# Patient Record
Sex: Female | Born: 1995 | Race: Black or African American | Hispanic: No | Marital: Single | Smoking: Never smoker
Health system: Southern US, Community
[De-identification: ages and names within clinical notes are randomized; demographics above are authoritative.]

---

## 2019-01-14 ENCOUNTER — Other Ambulatory Visit: Payer: Self-pay | Admitting: Nurse Practitioner

## 2019-01-14 ENCOUNTER — Ambulatory Visit
Admission: RE | Admit: 2019-01-14 | Discharge: 2019-01-14 | Disposition: A | Discharge status: Home / Self Care | Payer: Commercial Managed Care - PPO | Source: Ambulatory Visit | Attending: Nurse Practitioner | Admitting: Nurse Practitioner

## 2019-01-14 DIAGNOSIS — M25531 Pain in right wrist: Secondary | ICD-10-CM

## 2019-02-12 ENCOUNTER — Other Ambulatory Visit: Payer: Self-pay | Admitting: Family Medicine

## 2019-02-12 DIAGNOSIS — E041 Nontoxic single thyroid nodule: Secondary | ICD-10-CM

## 2019-02-19 ENCOUNTER — Other Ambulatory Visit: Payer: Commercial Managed Care - PPO

## 2019-03-02 ENCOUNTER — Ambulatory Visit
Admission: RE | Admit: 2019-03-02 | Discharge: 2019-03-02 | Disposition: A | Discharge status: Home / Self Care | Payer: Commercial Managed Care - PPO | Source: Ambulatory Visit | Attending: Family Medicine | Admitting: Family Medicine

## 2019-03-02 ENCOUNTER — Other Ambulatory Visit: Payer: Self-pay

## 2019-03-02 DIAGNOSIS — E041 Nontoxic single thyroid nodule: Secondary | ICD-10-CM

## 2019-03-02 MED ORDER — IOPAMIDOL (ISOVUE-300) INJECTION 61%
75.0000 mL | Freq: Once | INTRAVENOUS | Status: AC | PRN
  Administered 2019-03-02: 75 mL via INTRAVENOUS

## 2019-05-12 ENCOUNTER — Other Ambulatory Visit: Payer: Self-pay

## 2019-05-12 ENCOUNTER — Ambulatory Visit
Admission: RE | Admit: 2019-05-12 | Discharge: 2019-05-12 | Disposition: A | Discharge status: Home / Self Care | Payer: Commercial Managed Care - PPO | Source: Ambulatory Visit | Attending: Nurse Practitioner | Admitting: Nurse Practitioner

## 2019-05-12 ENCOUNTER — Other Ambulatory Visit: Payer: Self-pay | Admitting: Nurse Practitioner

## 2019-05-12 DIAGNOSIS — M25532 Pain in left wrist: Secondary | ICD-10-CM

## 2020-01-21 ENCOUNTER — Other Ambulatory Visit (HOSPITAL_COMMUNITY)
Admission: RE | Admit: 2020-01-21 | Discharge: 2020-01-21 | Disposition: A | Discharge status: Home / Self Care | Payer: Commercial Managed Care - PPO | Source: Ambulatory Visit | Attending: Nurse Practitioner | Admitting: Nurse Practitioner

## 2020-01-21 ENCOUNTER — Other Ambulatory Visit: Payer: Self-pay | Admitting: Nurse Practitioner

## 2020-01-21 DIAGNOSIS — Z124 Encounter for screening for malignant neoplasm of cervix: Secondary | ICD-10-CM | POA: Insufficient documentation

## 2020-01-26 LAB — CYTOLOGY - PAP
Adequacy: ABSENT
Chlamydia: NEGATIVE
Comment: NEGATIVE
Comment: NEGATIVE
Comment: NEGATIVE
Comment: NEGATIVE
Comment: NORMAL
Diagnosis: NEGATIVE
HSV1: NEGATIVE
HSV2: NEGATIVE
High risk HPV: NEGATIVE
Neisseria Gonorrhea: NEGATIVE
Trichomonas: NEGATIVE

## 2020-09-02 IMAGING — CT CT NECK WITH CONTRAST
2 of 4 series · 5 of 14 positions shown, 6 images · IV contrast (iopamidol)
Comparison: None.

CLINICAL DATA: Enlarged thyroid physical exam.

EXAM:
CT NECK WITH CONTRAST
TECHNIQUE: Multidetector CT imaging of the neck was performed using the
standard protocol following the bolus administration of intravenous
contrast.
CONTRAST:  75mL [id number] IOPAMIDOL ([id number]) INJECTION 61%

[Series 3: neck · axial · 0.53mm/px · z∈[-240,-166]mm · 2 of 113 slices shown]
[im 38/113  bone]
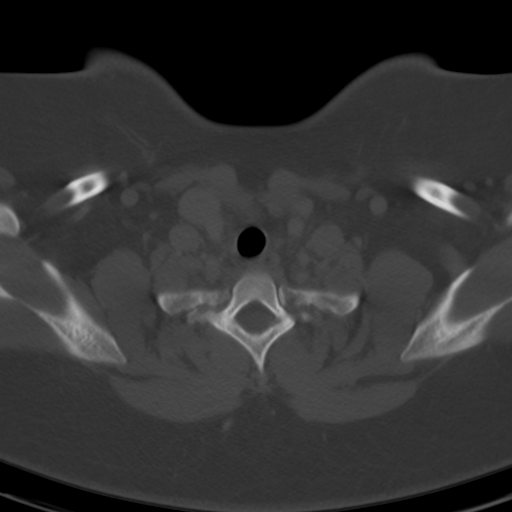
[im 75/113  bone]
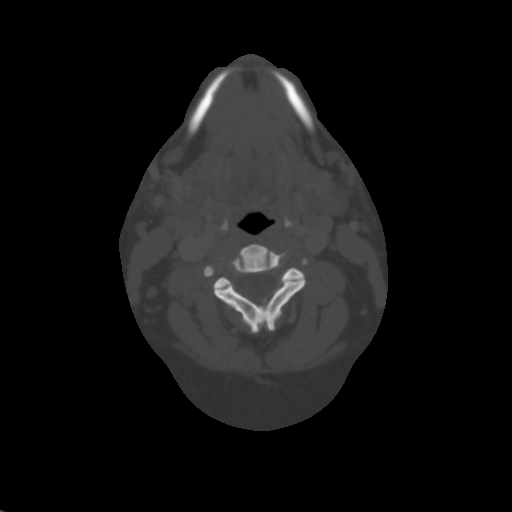

[Series 8: angled axial-oropharynx · axial · 0.52mm/px · z∈[-275,-164]mm · 3 of 113 slices shown, 4 images]
[im 29/113  soft-tissue]
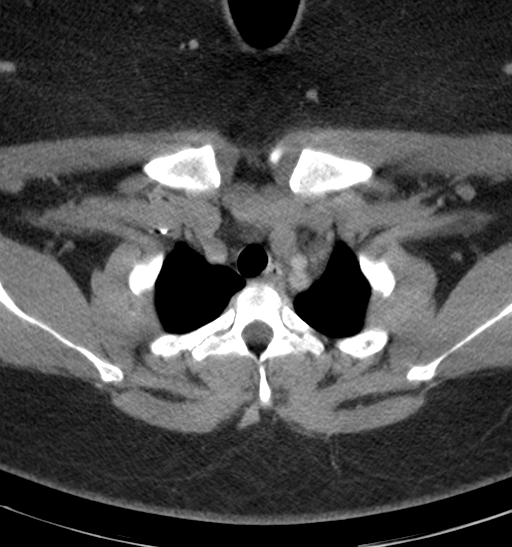
[im 29/113  bone]
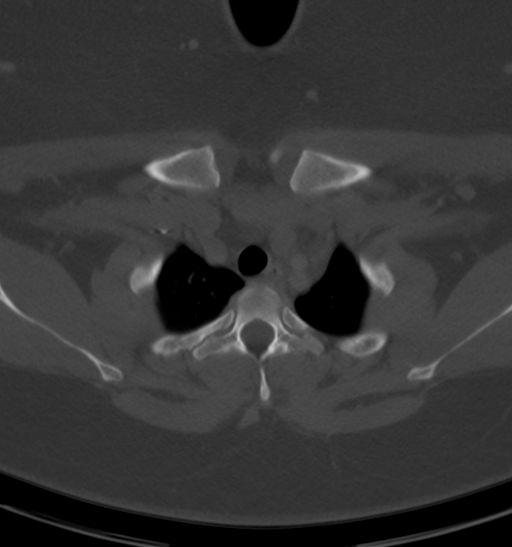
[im 57/113  bone]
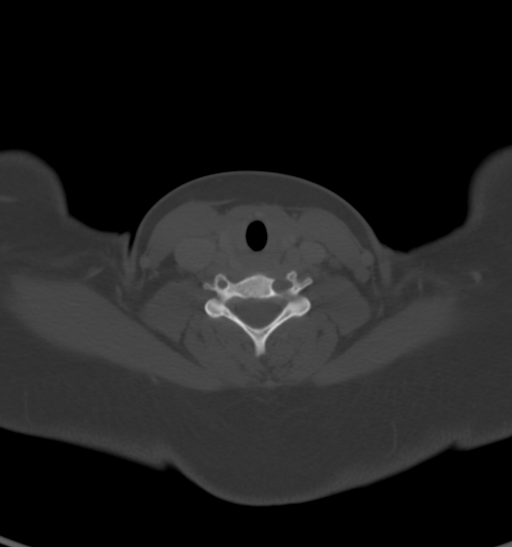
[im 85/113  bone]
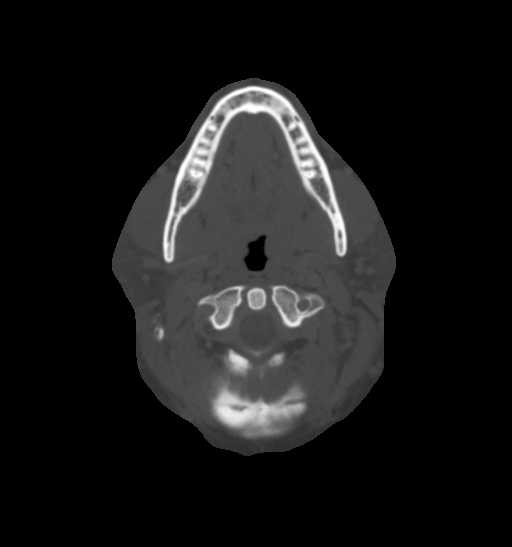

[5 of 14 positions shown; findings below may reference images not displayed]

FINDINGS: Pharynx and larynx: No mucosal or submucosal lesion.

Salivary glands: Parotid and submandibular glands are normal.

Thyroid: Thyroid gland appears normal by CT. Right and left lobes
are symmetric and do not show any focal lesion. Size appears within
normal limits.

Lymph nodes: No enlarged or low-density nodes on either side of the
neck. Normal bilateral cervical nodes.

Vascular: Normal

Limited intracranial: Normal

Visualized orbits: Not included.

Mastoids and visualized paranasal sinuses: Clear

Skeleton: Normal

Upper chest: Normal

Other: None
IMPRESSION: Normal CT scan of the neck. The thyroid gland appears to be normal
in size without evidence of focal lesion. No other regional mass
identified. No adenopathy.

## 2020-11-12 IMAGING — CR DG WRIST COMPLETE 3+V*L*
4 series · 4 of 4 positions shown · non-contrast
Comparison: None.

CLINICAL DATA: Pain with recent fall

EXAM:
LEFT WRIST - COMPLETE 3+ VIEW

[x wrist pa left]
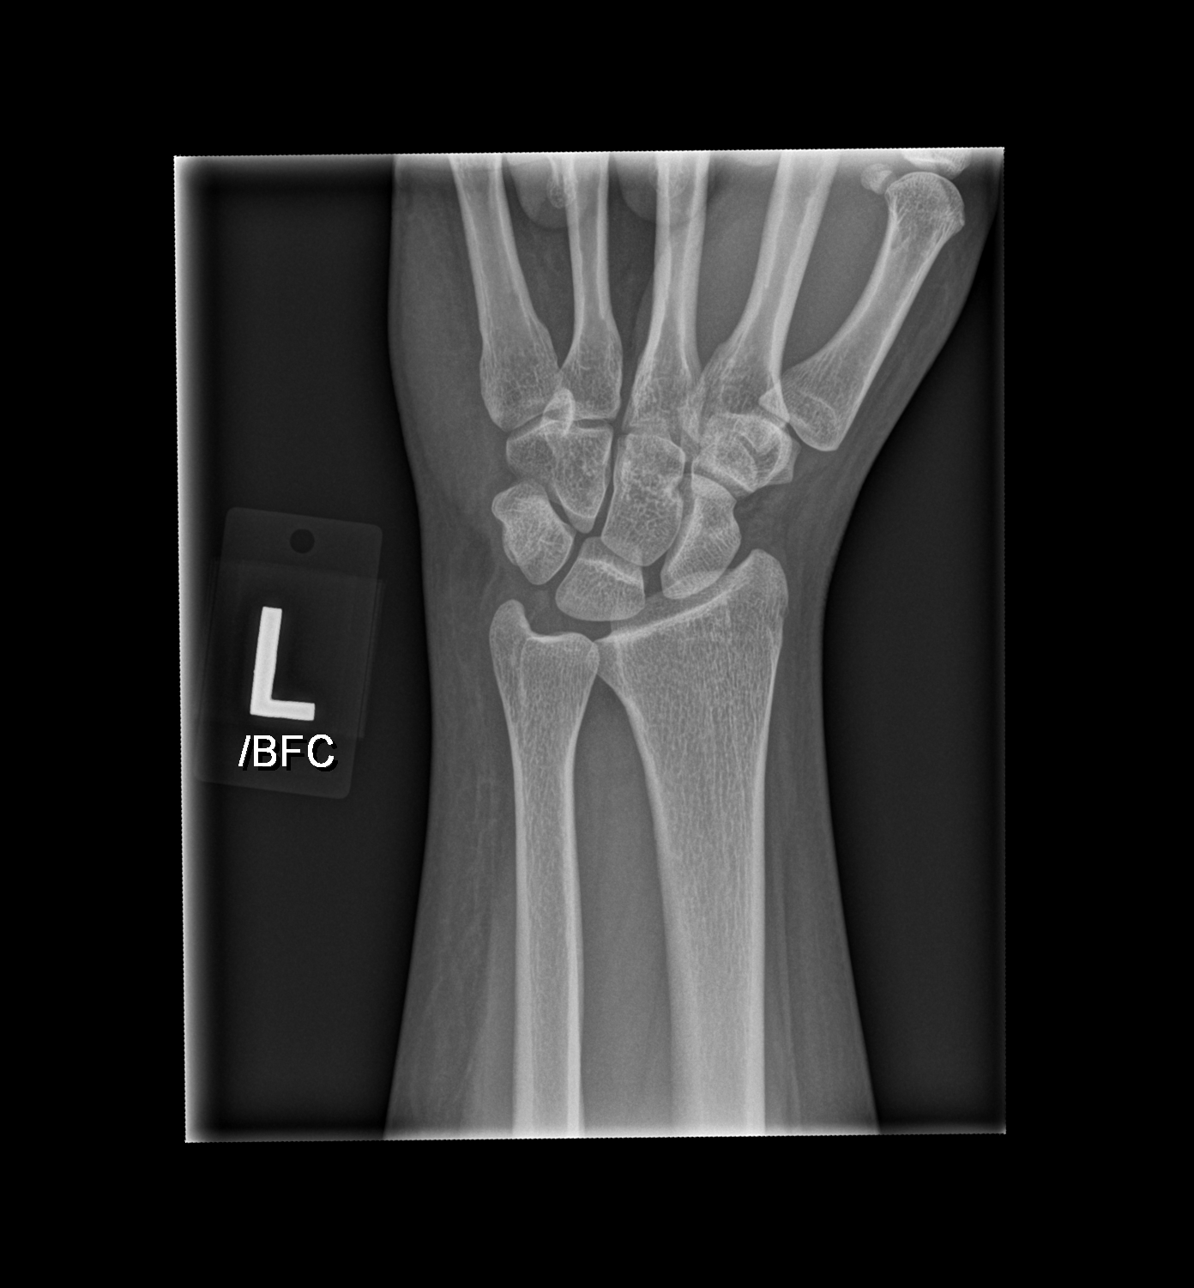

[x wrist obl left]
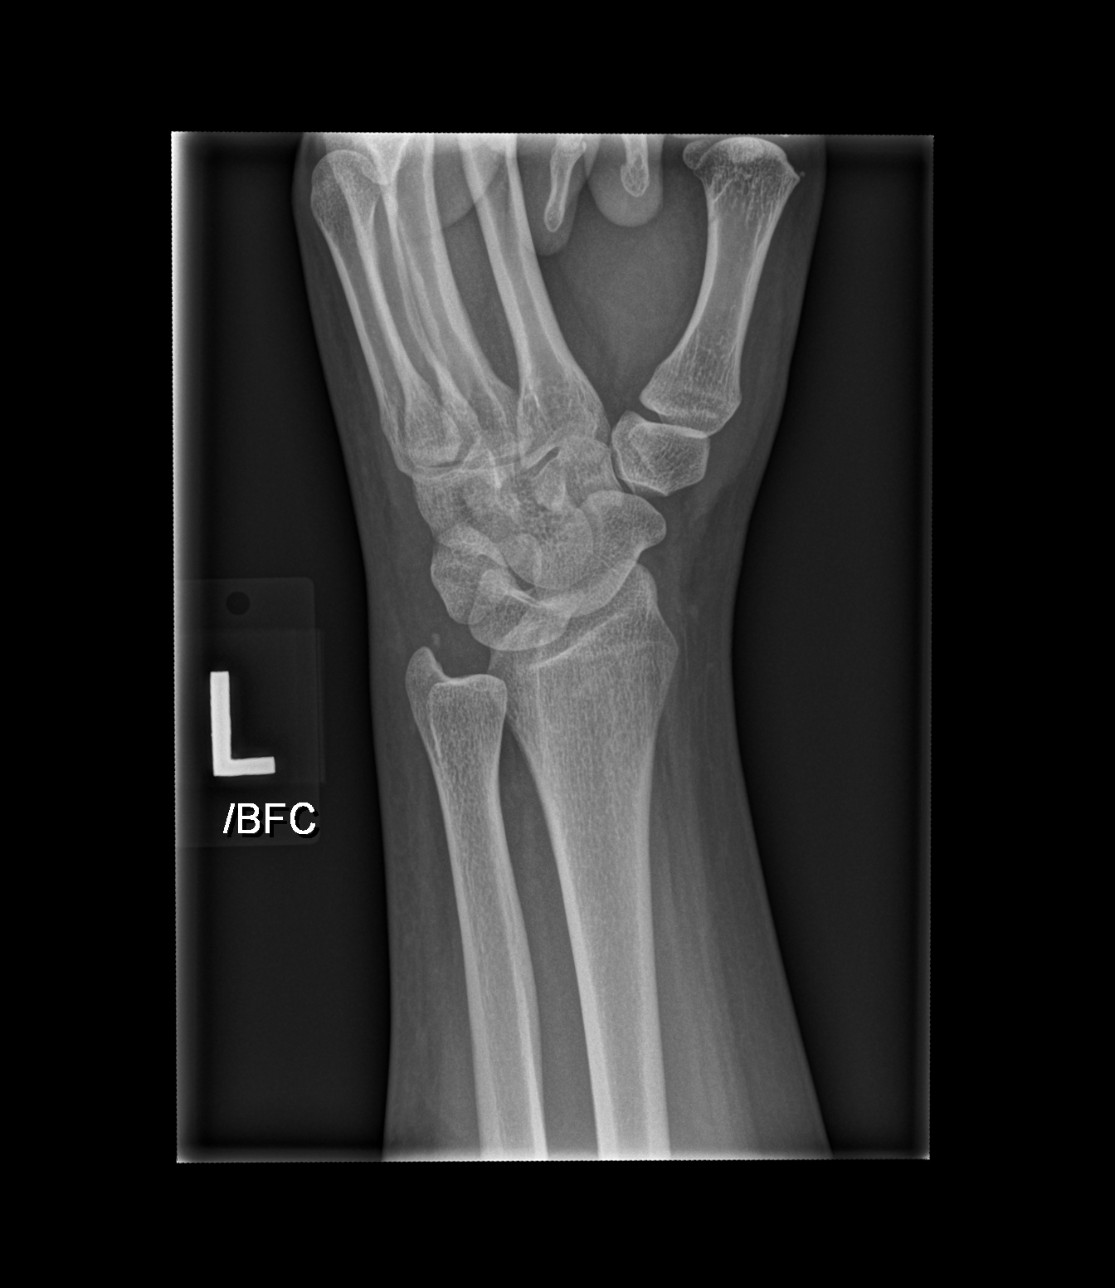

[x wrist lat left]
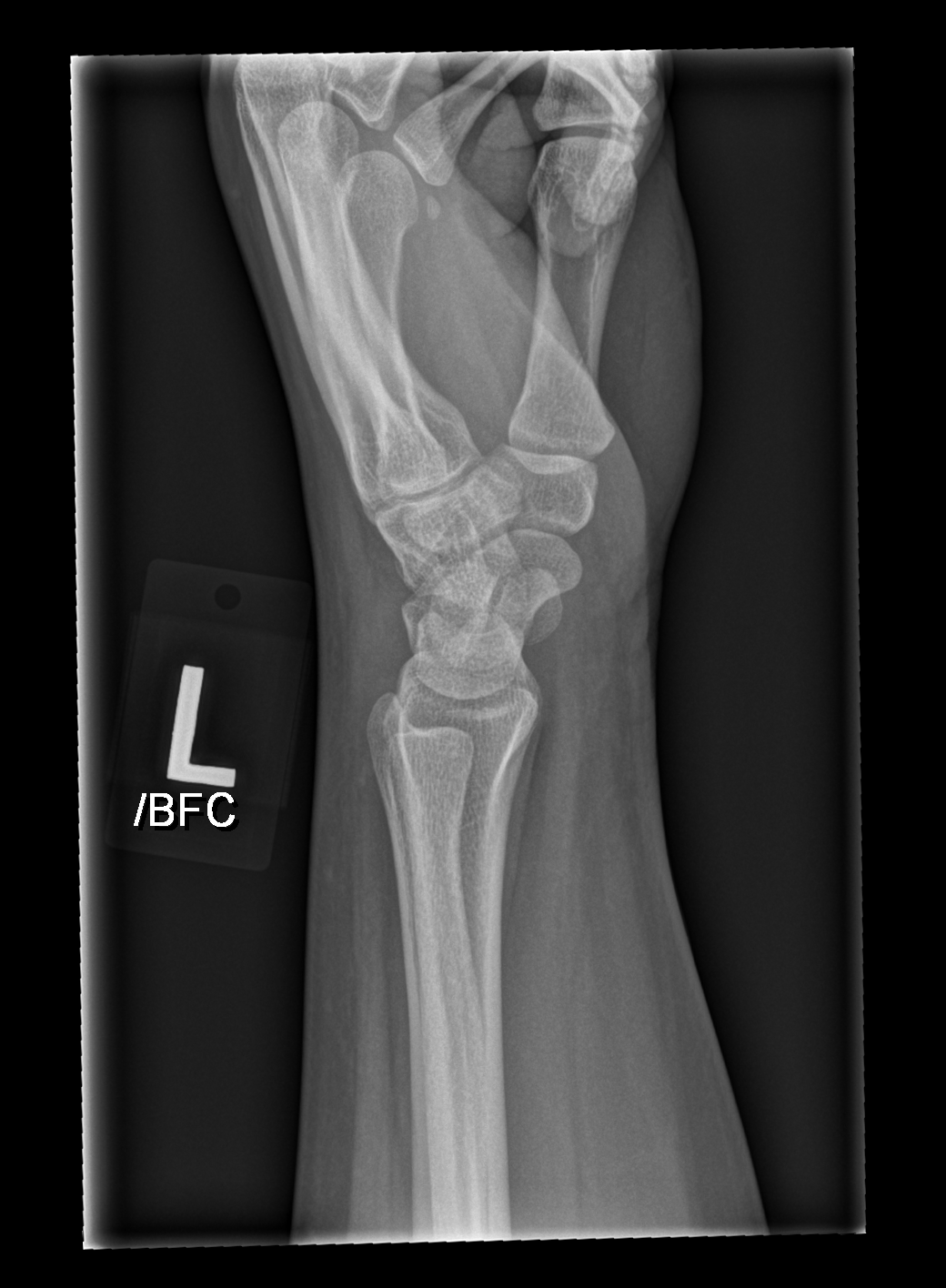

[x wrist navicular view left]
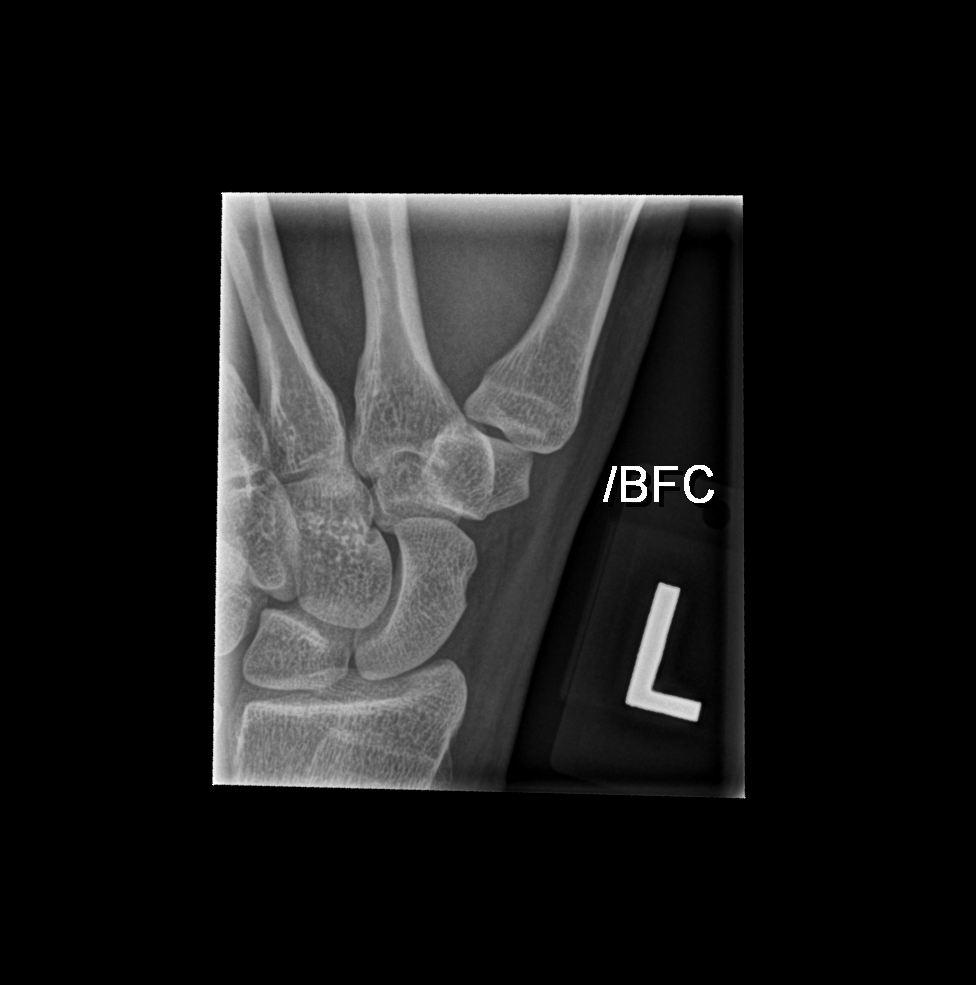

[4 of 4 positions shown; findings below may reference images not displayed]

FINDINGS: Frontal, oblique, lateral, and ulnar deviation scaphoid images were
obtained. There is a small age uncertain avulsion arising from the
ulnar styloid. No other evident fracture. No dislocation.

The space between the lunate and scaphoid on the frontal view is at
the upper limits of normal. Joint spaces appear normal. No erosive
change.
IMPRESSION: Age uncertain small avulsion arising from the ulnar styloid. Recent
injury in this area cannot be excluded by radiography. No other
evident fracture. No dislocation.

The space between the scaphoid and lunate on the frontal view is
toward the upper limits of normal. Advise clinical assessment of
this area to assess for possible ligamentous injury in this region.

No appreciable joint space narrowing or erosion.

These results will be called to the ordering clinician or
representative by the Radiologist Assistant, and communication
documented in the PACS or zVision Dashboard.

## 2021-10-13 ENCOUNTER — Encounter (HOSPITAL_BASED_OUTPATIENT_CLINIC_OR_DEPARTMENT_OTHER): Payer: Self-pay | Admitting: *Deleted

## 2021-10-13 ENCOUNTER — Emergency Department (HOSPITAL_BASED_OUTPATIENT_CLINIC_OR_DEPARTMENT_OTHER)
Admission: EM | Admit: 2021-10-13 | Discharge: 2021-10-13 | Disposition: A | Discharge status: Home / Self Care | Payer: BC Managed Care – PPO | Attending: Emergency Medicine | Admitting: Emergency Medicine

## 2021-10-13 ENCOUNTER — Emergency Department (HOSPITAL_BASED_OUTPATIENT_CLINIC_OR_DEPARTMENT_OTHER): Payer: BC Managed Care – PPO

## 2021-10-13 ENCOUNTER — Other Ambulatory Visit: Payer: Self-pay

## 2021-10-13 DIAGNOSIS — K8020 Calculus of gallbladder without cholecystitis without obstruction: Secondary | ICD-10-CM | POA: Diagnosis not present

## 2021-10-13 DIAGNOSIS — R1010 Upper abdominal pain, unspecified: Secondary | ICD-10-CM | POA: Diagnosis present

## 2021-10-13 LAB — COMPREHENSIVE METABOLIC PANEL
ALT: 13 U/L (ref 0–44)
AST: 31 U/L (ref 15–41)
Albumin: 3.9 g/dL (ref 3.5–5.0)
Alkaline Phosphatase: 46 U/L (ref 38–126)
Anion gap: 9 (ref 5–15)
BUN: <5 mg/dL — ABNORMAL LOW (ref 6–20)
CO2: 26 mmol/L (ref 22–32)
Calcium: 9.1 mg/dL (ref 8.9–10.3)
Chloride: 101 mmol/L (ref 98–111)
Creatinine, Ser: 0.68 mg/dL (ref 0.44–1.00)
GFR, Estimated: >60 mL/min (ref >60)
Glucose, Bld: 110 mg/dL — ABNORMAL HIGH (ref 70–99)
Potassium: 3.6 mmol/L (ref 3.5–5.1)
Sodium: 136 mmol/L (ref 135–145)
Total Bilirubin: 0.6 mg/dL (ref 0.3–1.2)
Total Protein: 7.8 g/dL (ref 6.5–8.1)

## 2021-10-13 LAB — CBC
HCT: 38.1 % (ref 36.0–46.0)
Hemoglobin: 12.3 g/dL (ref 12.0–15.0)
MCH: 26.3 pg (ref 26.0–34.0)
MCHC: 32.3 g/dL (ref 30.0–36.0)
MCV: 81.4 fL (ref 80.0–100.0)
Platelets: 302 10*3/uL (ref 150–400)
RBC: 4.68 MIL/uL (ref 3.87–5.11)
RDW: 14.7 % (ref 11.5–15.5)
WBC: 11.7 10*3/uL — ABNORMAL HIGH (ref 4.0–10.5)
nRBC: 0 % (ref 0.0–0.2)

## 2021-10-13 LAB — HCG, SERUM, QUALITATIVE: Preg, Serum: NEGATIVE

## 2021-10-13 LAB — LIPASE, BLOOD: Lipase: 35 U/L (ref 11–51)

## 2021-10-13 MED ORDER — OXYCODONE-ACETAMINOPHEN 5-325 MG PO TABS
1.0000 | ORAL_TABLET | Freq: Once | ORAL | Status: AC
  Administered 2021-10-13: 1 via ORAL
  Filled 2021-10-13: qty 1

## 2021-10-13 MED ORDER — IBUPROFEN 800 MG PO TABS: 800.0000 mg | ORAL_TABLET | Freq: Three times a day (TID) | ORAL | 0 refills | Status: AC | PRN

## 2021-10-13 MED ORDER — SODIUM CHLORIDE 0.9 % IV BOLUS
1000.0000 mL | Freq: Once | INTRAVENOUS | Status: AC
  Administered 2021-10-13: 1000 mL via INTRAVENOUS

## 2021-10-13 MED ORDER — ONDANSETRON HCL 4 MG/2ML IJ SOLN
4.0000 mg | Freq: Once | INTRAMUSCULAR | Status: AC
  Administered 2021-10-13: 4 mg via INTRAVENOUS
  Filled 2021-10-13: qty 2

## 2021-10-13 MED ORDER — SODIUM CHLORIDE 0.9 % IV SOLN
2.0000 g | Freq: Once | INTRAVENOUS | Status: AC
  Administered 2021-10-13: 2 g via INTRAVENOUS
  Filled 2021-10-13: qty 20

## 2021-10-13 MED ORDER — MORPHINE SULFATE (PF) 4 MG/ML IV SOLN
4.0000 mg | Freq: Once | INTRAVENOUS | Status: AC
  Administered 2021-10-13: 4 mg via INTRAVENOUS
  Filled 2021-10-13: qty 1

## 2021-10-13 MED ORDER — OXYCODONE-ACETAMINOPHEN 5-325 MG PO TABS: 1.0000 | ORAL_TABLET | Freq: Four times a day (QID) | ORAL | 0 refills | Status: AC | PRN

## 2021-10-13 MED ORDER — ONDANSETRON 4 MG PO TBDP: 4.0000 mg | ORAL_TABLET | Freq: Three times a day (TID) | ORAL | 0 refills | Status: AC | PRN

## 2021-10-13 NOTE — ED Provider Notes (Signed)
?[facility] EMERGENCY DEPARTMENT ?Provider Note ? ? ?CSN: [id number] ?Arrival date & time: 10/13/21  1433 ? ?  ? ?History ? ?Chief Complaint  ?Patient presents with  ? Abdominal Pain  ? ? ?Erika FurlongShalia Lemieux is a 26 y.o. female who presents to the ED today with complaint of gradual onset, constant, worsening, sharp, upper abdominal pain that began last night.  Patient reports issues with digestion over the past year.  She reports that she tried cutting out gluten in her diet which seem to help.  She does admit that yesterday she cooked shrimp and Crisco and gluten-free Panko crumbs as well as other vegetables.  She states that shortly afterwards she began noticing bloating in her abdomen and thought that she might be constipated.  She used a laxative and states that she has had her stool since that time.  She reports that shortly afterwards she began having severe upper abdominal pain.  She also complains of nausea and nonbloody nonbilious emesis.  She assumed that she might have food poisoning due to excessive vomiting.  [facility] today who sent her here for further evaluation due to the amount of discomfort she has been.  She has tried Tylenol for pain without relief.  Normal menstrual cycle 2 weeks ago.  No previous abdominal surgeries.  Denies fevers or chills.  ? ?The history is provided by medical records and the patient.  ? ?  ? ?Home Medications ?Prior to Admission medications   ?Medication Sig Start Date End Date Taking? Authorizing Provider  ?ibuprofen (ADVIL) 800 MG tablet Take 1 tablet (800 mg total) by mouth every 8 (eight) hours as needed for moderate pain. 10/13/21  Yes Tekeisha Hakim, PA-C  ?ondansetron (ZOFRAN-ODT) 4 MG disintegrating tablet Take 1 tablet (4 mg total) by mouth every 8 (eight) hours as needed for nausea or vomiting. 10/13/21  Yes Sayana Salley, PA-C  ?oxyCODONE-acetaminophen (PERCOCET/ROXICET) 5-325 MG tablet Take 1 tablet by mouth every 6  (six) hours as needed for severe pain. 10/13/21  Yes Tanda RockersVenter, Jaelynn Pozo, PA-C  ?   ? ?Allergies    ?Patient has no known allergies.   ? ?Review of Systems   ?Review of Systems  ?Constitutional:  Negative for chills and fever.  ?Gastrointestinal:  Positive for abdominal pain, nausea and vomiting. Negative for constipation.  ?Genitourinary:  Negative for flank pain, frequency and menstrual problem.  ?All other systems reviewed and are negative. ? ?Physical Exam ?Updated Vital Signs ?BP (!) 118/92 (BP Location: Left Arm)   Pulse 77   Temp 98.2 ?F (36.8 ?C) (Oral)   Resp 17   Ht 5\' 5"  (1.651 m)   Wt 107.1 kg   LMP 09/29/2021   SpO2 96%   BMI 39.31 kg/m?  ?Physical Exam ?Vitals and nursing note reviewed.  ?Constitutional:   ?   Appearance: She is obese. She is not ill-appearing or diaphoretic.  ?HENT:  ?   Head: Normocephalic and atraumatic.  ?Eyes:  ?   Conjunctiva/sclera: Conjunctivae normal.  ?Cardiovascular:  ?   Rate and Rhythm: Normal rate and regular rhythm.  ?   Heart sounds: Normal heart sounds.  ?Pulmonary:  ?   Effort: Pulmonary effort is normal.  ?   Breath sounds: Normal breath sounds. No wheezing, rhonchi or rales.  ?Abdominal:  ?   Palpations: Abdomen is soft.  ?   Tenderness: There is abdominal tenderness in the right upper quadrant, epigastric area and left upper quadrant. There is  no guarding (voluntary) or rebound.  ?Musculoskeletal:  ?   Cervical back: Neck supple.  ?Skin: ?   General: Skin is warm and dry.  ?Neurological:  ?   Mental Status: She is alert.  ? ? ?ED Results / Procedures / Treatments   ?Labs ?(all labs ordered are listed, but only abnormal results are displayed) ?Labs Reviewed  ?COMPREHENSIVE METABOLIC PANEL - Abnormal; Notable for the following components:  ?    Result Value  ? Glucose, Bld 110 (*)   ? BUN <5 (*)   ? All other components within normal limits  ?CBC - Abnormal; Notable for the following components:  ? WBC 11.7 (*)   ? All other components within normal limits   ?LIPASE, BLOOD  ?HCG, SERUM, QUALITATIVE  ?URINALYSIS, ROUTINE W REFLEX MICROSCOPIC  ?PREGNANCY, URINE  ? ? ?EKG ?None ? ?Radiology ?US Abdomen Limited RUQ (LIVER/GB) ? ?Result Date: 10/13/2021 ?CLINICAL DATA:  A 26 year old female presents for evaluation of RIGHT upper quadrant pain for 1 day with nausea vomiting. EXAM: ULTRASOUND ABDOMEN LIMITED RIGHT UPPER QUADRANT COMPARISON:  None. FINDINGS: Gallbladder: Multiple gallstones in the gallbladder lumen. No reported tenderness over the gallbladder. Gallbladder mildly distended. No pericholecystic fluid. Images limited by patient body habitus. Some images suggest wall thickness up to 5 mm perhaps even with some irregularity. Common bile duct: Diameter: 3 mm Liver: Mild increased echogenicity of hepatic parenchyma compatible with hepatic steatosis. Portal vein is patent on color Doppler imaging with normal direction of blood flow towards the liver. Other: No ascites. IMPRESSION: Numerous gallstones fill the lumen of the gallbladder measuring up to 1.5 cm and perhaps even larger with extensive shadowing but with no reported tenderness over the gallbladder. Some images suggest irregular gallbladder wall thickening. Images are compromised by patient body habitus. Findings are equivocal for acute cholecystitis though this possibility is entertained based on the appearance of the gallbladder wall on some images. CT and or HIDA scan may be helpful for further evaluation. Hepatic steatosis. Electronically Signed   By: Donzetta Kohut M.D.   On: 10/13/2021 16:55   ? ?Procedures ?Procedures  ? ? ?Medications Ordered in ED ?Medications  ?morphine (PF) 4 MG/ML injection 4 mg (4 mg Intravenous Given 10/13/21 1542)  ?sodium chloride 0.9 % bolus 1,000 mL (0 mLs Intravenous Stopped 10/13/21 1711)  ?ondansetron (ZOFRAN) injection 4 mg (4 mg Intravenous Given 10/13/21 1539)  ?cefTRIAXone (ROCEPHIN) 2 g in sodium chloride 0.9 % 100 mL IVPB (0 g Intravenous Stopped 10/13/21 1844)   ?oxyCODONE-acetaminophen (PERCOCET/ROXICET) 5-325 MG per tablet 1 tablet (1 tablet Oral Given 10/13/21 1747)  ? ? ?ED Course/ Medical Decision Making/ A&P ?  ?                        ?Medical Decision Making ?26 year old female who presents to the ED today with complaint of upper abdominal pain, nausea, vomiting that began yesterday.  On arrival to the ED patient is afebrile, nontachycardic and nontachypneic however does appear slightly uncomfortable on exam.  She is noted to have diffuse upper abdominal tenderness palpation with voluntary guarding.  No lower abdominal tenderness palpation.  Abdomen soft.  She was seen by PCP earlier today and sent here for further evaluation.  Denies any NSAID use or EtOH.  Differential diagnosis includes viral gastroenteritis versus GERD versus peptic ulcer disease versus pancreatitis versus gallbladder etiology.  We will plan for lab work including CBC, CMP, lipase, UPT, UA.  Will provide fluids, antiemetics, pain  medication and plan for right upper quadrant ultrasound for further evaluation.  ? ?Problems Addressed: ?Calculus of gallbladder without cholecystitis without obstruction: acute illness or injury ?   Details: Pt's pain has been controlled in the ED. Discussed case with general surgeon Dr. Magnus Ivan who recommends outpatient followup.  Has been treated with a 1 time dose of Rocephin in the ED per his request due to unewuivocal findings on ultrasound for possibility of cholecystitis however given pain controlled/no fever/stable vitals she will be discharged with outpatient follow up. Have discussed strict return precautions with pt. She is to be discharged home with Ibuprofen, Percocet for severe pain, and zofran. She is in agreement with plan and stable for discharge home. ? ?Amount and/or Complexity of Data Reviewed ?Labs: ordered. ?   Details: CBC with mild leukocytosis 11,700. Hgb stable at 12.3. ?CMP with glucose 110. No other electrolyte abnormalities. LFTS  unremarkable (31/13/46/0.6)  ?Lipase WNL at 35 ?Hcg neg ?Radiology: ordered and independent interpretation performed. ?   Details: RUQ ultrasound viewed by me prior to radiology read - does appaer to have sludge/stones in the gallbladder

## 2021-10-13 NOTE — ED Triage Notes (Signed)
Upper abdominal pain since last night. Dizziness. Vomiting. She was seen at clinic and told to come here.  ?

## 2021-10-13 NOTE — ED Notes (Signed)
Patient states pain is coming back. Provider made aware.

## 2021-10-13 NOTE — Discharge Instructions (Addendum)
Please pick up medication and take as prescribed. It is recommended that you take 800 mg Ibuprofen for moderate pain.  You can take the narcotic for severe pain.  ? ?Follow up with [facility] for further evaluation of your gallstones.  ? ?Attached is additional information on gallstones and an eating plan for same.  ? ?Return to the ED IMMEDIATELY for any new/worsening symptoms including worsening pain, pain lasting > 4 hours, excessive vomiting despite medications, fevers > 100.4, skin/eyes turning yellow, or any other new/concerning symptoms.  ?

## 2023-05-10 DIAGNOSIS — F909 Attention-deficit hyperactivity disorder, unspecified type: Secondary | ICD-10-CM | POA: Diagnosis not present

## 2023-06-11 DIAGNOSIS — F909 Attention-deficit hyperactivity disorder, unspecified type: Secondary | ICD-10-CM | POA: Diagnosis not present

## 2023-07-18 DIAGNOSIS — F909 Attention-deficit hyperactivity disorder, unspecified type: Secondary | ICD-10-CM | POA: Diagnosis not present

## 2023-08-14 DIAGNOSIS — Z713 Dietary counseling and surveillance: Secondary | ICD-10-CM | POA: Diagnosis not present

## 2023-08-29 DIAGNOSIS — R051 Acute cough: Secondary | ICD-10-CM | POA: Diagnosis not present

## 2023-08-29 DIAGNOSIS — J069 Acute upper respiratory infection, unspecified: Secondary | ICD-10-CM | POA: Diagnosis not present

## 2023-08-29 DIAGNOSIS — Z03818 Encounter for observation for suspected exposure to other biological agents ruled out: Secondary | ICD-10-CM | POA: Diagnosis not present

## 2023-08-29 DIAGNOSIS — J029 Acute pharyngitis, unspecified: Secondary | ICD-10-CM | POA: Diagnosis not present

## 2023-09-11 DIAGNOSIS — Z713 Dietary counseling and surveillance: Secondary | ICD-10-CM | POA: Diagnosis not present

## 2023-09-12 DIAGNOSIS — Z713 Dietary counseling and surveillance: Secondary | ICD-10-CM | POA: Diagnosis not present

## 2023-10-17 DIAGNOSIS — F909 Attention-deficit hyperactivity disorder, unspecified type: Secondary | ICD-10-CM | POA: Diagnosis not present

## 2023-11-01 DIAGNOSIS — Z713 Dietary counseling and surveillance: Secondary | ICD-10-CM | POA: Diagnosis not present

## 2023-11-20 DIAGNOSIS — F900 Attention-deficit hyperactivity disorder, predominantly inattentive type: Secondary | ICD-10-CM | POA: Diagnosis not present

## 2023-11-20 DIAGNOSIS — M5489 Other dorsalgia: Secondary | ICD-10-CM | POA: Diagnosis not present

## 2023-11-20 DIAGNOSIS — Z13220 Encounter for screening for lipoid disorders: Secondary | ICD-10-CM | POA: Diagnosis not present

## 2023-11-20 DIAGNOSIS — E559 Vitamin D deficiency, unspecified: Secondary | ICD-10-CM | POA: Diagnosis not present

## 2023-11-20 DIAGNOSIS — Z23 Encounter for immunization: Secondary | ICD-10-CM | POA: Diagnosis not present

## 2023-11-20 DIAGNOSIS — F419 Anxiety disorder, unspecified: Secondary | ICD-10-CM | POA: Diagnosis not present

## 2023-11-20 DIAGNOSIS — F32A Depression, unspecified: Secondary | ICD-10-CM | POA: Diagnosis not present

## 2023-11-20 DIAGNOSIS — Z0000 Encounter for general adult medical examination without abnormal findings: Secondary | ICD-10-CM | POA: Diagnosis not present

## 2023-11-20 DIAGNOSIS — D500 Iron deficiency anemia secondary to blood loss (chronic): Secondary | ICD-10-CM | POA: Diagnosis not present

## 2023-12-04 DIAGNOSIS — F909 Attention-deficit hyperactivity disorder, unspecified type: Secondary | ICD-10-CM | POA: Diagnosis not present

## 2023-12-06 DIAGNOSIS — Z713 Dietary counseling and surveillance: Secondary | ICD-10-CM | POA: Diagnosis not present

## 2024-01-13 DIAGNOSIS — F4323 Adjustment disorder with mixed anxiety and depressed mood: Secondary | ICD-10-CM | POA: Diagnosis not present

## 2024-01-14 DIAGNOSIS — F909 Attention-deficit hyperactivity disorder, unspecified type: Secondary | ICD-10-CM | POA: Diagnosis not present

## 2024-02-20 DIAGNOSIS — F909 Attention-deficit hyperactivity disorder, unspecified type: Secondary | ICD-10-CM | POA: Diagnosis not present

## 2024-03-18 DIAGNOSIS — Z713 Dietary counseling and surveillance: Secondary | ICD-10-CM | POA: Diagnosis not present

## 2024-04-07 DIAGNOSIS — F909 Attention-deficit hyperactivity disorder, unspecified type: Secondary | ICD-10-CM | POA: Diagnosis not present

## 2024-04-08 DIAGNOSIS — Z713 Dietary counseling and surveillance: Secondary | ICD-10-CM | POA: Diagnosis not present

## 2024-05-04 DIAGNOSIS — Z713 Dietary counseling and surveillance: Secondary | ICD-10-CM | POA: Diagnosis not present

## 2024-05-08 DIAGNOSIS — F4322 Adjustment disorder with anxiety: Secondary | ICD-10-CM | POA: Diagnosis not present

## 2024-05-11 DIAGNOSIS — F909 Attention-deficit hyperactivity disorder, unspecified type: Secondary | ICD-10-CM | POA: Diagnosis not present

## 2024-05-15 DIAGNOSIS — F4322 Adjustment disorder with anxiety: Secondary | ICD-10-CM | POA: Diagnosis not present

## 2024-05-29 DIAGNOSIS — F4322 Adjustment disorder with anxiety: Secondary | ICD-10-CM | POA: Diagnosis not present

## 2024-06-22 DIAGNOSIS — F909 Attention-deficit hyperactivity disorder, unspecified type: Secondary | ICD-10-CM | POA: Diagnosis not present
